# Patient Record
Sex: Female | Born: 1980 | Race: White | Hispanic: No | Marital: Single | Smoking: Former smoker
Health system: Southern US, Community
[De-identification: ages and names within clinical notes are randomized; demographics above are authoritative.]

## PROBLEM LIST (undated history)

## (undated) ENCOUNTER — Inpatient Hospital Stay (HOSPITAL_COMMUNITY): Payer: Self-pay

## (undated) DIAGNOSIS — R569 Unspecified convulsions: Secondary | ICD-10-CM

## (undated) DIAGNOSIS — Q282 Arteriovenous malformation of cerebral vessels: Secondary | ICD-10-CM

## (undated) HISTORY — DX: Arteriovenous malformation of cerebral vessels: Q28.2

---

## 2001-02-01 HISTORY — PX: BRAIN SURGERY: SHX531

## 2001-11-01 DIAGNOSIS — Q282 Arteriovenous malformation of cerebral vessels: Secondary | ICD-10-CM

## 2001-11-01 HISTORY — DX: Arteriovenous malformation of cerebral vessels: Q28.2

## 2002-03-02 ENCOUNTER — Ambulatory Visit (HOSPITAL_COMMUNITY): Admission: RE | Admit: 2002-03-02 | Discharge: 2002-03-02 | Payer: Self-pay | Admitting: Neurosurgery

## 2002-03-02 ENCOUNTER — Encounter: Payer: Self-pay | Admitting: Neurosurgery

## 2002-09-17 ENCOUNTER — Other Ambulatory Visit: Admission: RE | Admit: 2002-09-17 | Discharge: 2002-09-17 | Payer: Self-pay | Admitting: Obstetrics and Gynecology

## 2003-02-13 ENCOUNTER — Inpatient Hospital Stay (HOSPITAL_COMMUNITY): Admission: AD | Admit: 2003-02-13 | Discharge: 2003-02-13 | Payer: Self-pay | Admitting: Obstetrics and Gynecology

## 2003-05-04 ENCOUNTER — Inpatient Hospital Stay (HOSPITAL_COMMUNITY): Admission: AD | Admit: 2003-05-04 | Discharge: 2003-05-07 | Payer: Self-pay | Admitting: Obstetrics and Gynecology

## 2003-05-08 ENCOUNTER — Ambulatory Visit: Admission: RE | Admit: 2003-05-08 | Discharge: 2003-05-08 | Payer: Self-pay | Admitting: Obstetrics and Gynecology

## 2003-05-27 ENCOUNTER — Emergency Department (HOSPITAL_COMMUNITY): Admission: EM | Admit: 2003-05-27 | Discharge: 2003-05-27 | Payer: Self-pay | Admitting: Emergency Medicine

## 2003-09-12 ENCOUNTER — Other Ambulatory Visit: Admission: RE | Admit: 2003-09-12 | Discharge: 2003-09-12 | Payer: Self-pay | Admitting: Obstetrics and Gynecology

## 2004-10-01 ENCOUNTER — Emergency Department (HOSPITAL_COMMUNITY): Admission: EM | Admit: 2004-10-01 | Discharge: 2004-10-01 | Payer: Self-pay | Admitting: Emergency Medicine

## 2012-07-12 ENCOUNTER — Ambulatory Visit (INDEPENDENT_AMBULATORY_CARE_PROVIDER_SITE_OTHER): Payer: BC Managed Care – PPO | Admitting: Family Medicine

## 2012-07-12 VITALS — BP 124/78 | HR 89 | Temp 98.0°F | Resp 16 | Ht 68.5 in | Wt 153.0 lb

## 2012-07-12 DIAGNOSIS — H60392 Other infective otitis externa, left ear: Secondary | ICD-10-CM

## 2012-07-12 DIAGNOSIS — J069 Acute upper respiratory infection, unspecified: Secondary | ICD-10-CM

## 2012-07-12 DIAGNOSIS — H6980 Other specified disorders of Eustachian tube, unspecified ear: Secondary | ICD-10-CM

## 2012-07-12 DIAGNOSIS — H60399 Other infective otitis externa, unspecified ear: Secondary | ICD-10-CM

## 2012-07-12 MED ORDER — AMOXICILLIN-POT CLAVULANATE 875-125 MG PO TABS: 1.0000 | ORAL_TABLET | Freq: Two times a day (BID) | ORAL | Status: DC

## 2012-07-12 NOTE — Progress Notes (Signed)
[address]   [place], [place]  [address]   [phone]  Subjective:    Patient ID: Melanie Vazquez, female    DOB: 1980/07/28, 32 y.o.   MRN: [id number]  HPI This 32 y.o. female presents for evaluation of ear pain, sore throat, congestion.   Onset four days ago.  Went to [facility] yesterday; diagnosed with Otitis Externa; ears so swollen, ear drops are running out; [facility].  Throat culture sent out at [facility].  +fever Tmax 101.  +ST; +ear pain; no pain with laying on ears.  Decreased hearing.  Scant drainage from ears; not sure if due to left over drops.  No rhinorrhea; +nasal congestion mild; green mucous. +dry cough; no sputum; no SOB.  No v/d.  No excessive swimming.  Taking Claritin D and Flonase for past 24 hours.    PCP:  Bubba Hales.   Review of Systems  Constitutional: Positive for fever. Negative for chills, diaphoresis and fatigue.  HENT: Positive for hearing loss, ear pain, congestion, sore throat and ear discharge. Negative for rhinorrhea, sneezing, drooling, mouth sores, trouble swallowing, neck pain, neck stiffness, voice change, postnasal drip and sinus pressure.   Respiratory: Positive for cough. Negative for shortness of breath, wheezing and stridor.   Gastrointestinal: Negative for nausea, vomiting, abdominal pain and diarrhea.  Skin: Negative for rash.  Neurological: Negative for headaches.    Past Medical History  Diagnosis Date  . 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    Past Surgical History  Procedure Laterality Date  . Brain surgery    . Cesarean section      Prior to Admission medications   Medication Sig Start Date End Date Taking? Authorizing Provider  loratadine (CLARITIN) 10 MG tablet Take 10 mg by mouth daily.   Yes Historical Provider, MD  ofloxacin (FLOXIN) 0.3 % otic solution Place 5 drops into both ears 2 (two) times daily.   Yes Historical Provider, MD  amoxicillin-clavulanate  (AUGMENTIN) 875-125 MG per tablet Take 1 tablet by mouth 2 (two) times daily. 07/12/12   Ethelda Chick, MD    Not on File  History   Social History  . Marital Status: Married    Spouse Name: N/A    Number of Children: N/A  . Years of Education: N/A   Occupational History  . Not on file.   Social History Main Topics  . Smoking status: Former Games developer  . Smokeless tobacco: Not on file  . Alcohol Use: Yes  . Drug Use: No  . Sexually Active: Yes    Birth Control/ Protection: IUD   Other Topics Concern  . Not on file   Social History Narrative  . No narrative on file    History reviewed. No pertinent family history.     Objective:   Physical Exam  Nursing note and vitals reviewed. Constitutional: She is oriented to person, place, and time. She appears well-developed and well-nourished. No distress.  HENT:  Head: Normocephalic and atraumatic.  Right Ear: External ear normal.  Left Ear: External ear normal.  Nose: Nose normal.  Mouth/Throat: Oropharynx is clear and moist. No oropharyngeal exudate.  Mild TTP with manipulation of external ear L.  Eyes: Conjunctivae and EOM are normal. Pupils are equal, round, and reactive to light.  Neck: Normal range of motion. Neck supple.  Cardiovascular: Normal rate and regular rhythm.   Pulmonary/Chest: Effort normal and breath sounds normal.  Lymphadenopathy:  She has cervical adenopathy.  Neurological: She is alert and oriented to person, place, and time.  Skin: Skin is warm and dry. No rash noted. She is not diaphoretic.  Psychiatric: She has a normal mood and affect. Her behavior is normal. Judgment and thought content normal.        Assessment & Plan:  Acute upper respiratory infections of unspecified site  Eustachian tube dysfunction, unspecified laterality - Plan: amoxicillin-clavulanate (AUGMENTIN) 875-125 MG per tablet  Otitis, externa, infective, left   1.  Acute URI:  New.  S/p evaluation at [facility];  rapid strep negative and throat culture sent off.  Continue with Flonase and Claritin D.  Continue with Floxin Otic drops.   2.  Eustachian Tube Dysfunction B: New.  Continue with Flonase and Claritin D.  Recommend Ibuprofen tid. 3.  Otitis Externa L: New. Mild.  Continue Floxin Otic drops as prescribed; if no improvement in 72 hours, to fill Augmentin.    Meds ordered this encounter  Medications  . ofloxacin (FLOXIN) 0.3 % otic solution    Sig: Place 5 drops into both ears 2 (two) times daily.  Marland Kitchen loratadine (CLARITIN) 10 MG tablet    Sig: Take 10 mg by mouth daily.  Marland Kitchen amoxicillin-clavulanate (AUGMENTIN) 875-125 MG per tablet    Sig: Take 1 tablet by mouth 2 (two) times daily.    Dispense:  20 tablet    Refill:  0

## 2012-07-12 NOTE — Patient Instructions (Addendum)
1.  RECOMMEND CONTINUING CLARITIN D AND FLONASE FOR THE NEXT THREE DAYS. 2.  TAKE IBUPROFEN 3 TIMES DAILY FOR PAIN, INFLAMMATION. 3.  START AUGMENTIN IN 3-4 DAYS IF EARS ARE STILL PAINFUL.

## 2013-07-20 ENCOUNTER — Other Ambulatory Visit (HOSPITAL_COMMUNITY): Payer: Self-pay | Admitting: Family Medicine

## 2013-07-20 ENCOUNTER — Ambulatory Visit (HOSPITAL_COMMUNITY)
Admission: RE | Admit: 2013-07-20 | Discharge: 2013-07-20 | Disposition: A | Discharge status: Home / Self Care | Payer: BC Managed Care – PPO | Source: Ambulatory Visit | Attending: Vascular Surgery | Admitting: Vascular Surgery

## 2013-07-20 DIAGNOSIS — M7989 Other specified soft tissue disorders: Secondary | ICD-10-CM | POA: Insufficient documentation

## 2013-07-20 DIAGNOSIS — M79609 Pain in unspecified limb: Secondary | ICD-10-CM

## 2013-07-20 DIAGNOSIS — M79605 Pain in left leg: Secondary | ICD-10-CM

## 2013-08-29 ENCOUNTER — Ambulatory Visit (INDEPENDENT_AMBULATORY_CARE_PROVIDER_SITE_OTHER): Payer: BC Managed Care – PPO | Admitting: Emergency Medicine

## 2013-08-29 VITALS — BP 124/88 | HR 73 | Temp 98.1°F | Resp 16 | Ht 70.0 in | Wt 151.0 lb

## 2013-08-29 DIAGNOSIS — A088 Other specified intestinal infections: Secondary | ICD-10-CM

## 2013-08-29 LAB — POCT CBC
GRANULOCYTE PERCENT: 3.5 % — AB (ref 37–80)
HCT, POC: 36.1 % — AB (ref 37.7–47.9)
HEMOGLOBIN: 12.1 g/dL — AB (ref 12.2–16.2)
Lymph, poc: 2.3 (ref 0.6–3.4)
MCH: 31.2 pg (ref 27–31.2)
MCHC: 33.6 g/dL (ref 31.8–35.4)
MCV: 92.8 fL (ref 80–97)
MID (cbc): 0.3 (ref 0–0.9)
MPV: 7.8 fL (ref 0–99.8)
POC Granulocyte: 5.8 (ref 2–6.9)
POC LYMPH PERCENT: 27.6 %L (ref 10–50)
POC MID %: 3.5 %M (ref 0–12)
Platelet Count, POC: 197 10*3/uL (ref 142–424)
RBC: 3.89 M/uL — AB (ref 4.04–5.48)
RDW, POC: 13.4 %
WBC: 8.4 10*3/uL (ref 4.6–10.2)

## 2013-08-29 MED ORDER — LOPERAMIDE HCL 2 MG PO TABS: ORAL_TABLET | ORAL | Status: DC

## 2013-08-29 NOTE — Patient Instructions (Signed)
Viral Gastroenteritis Viral gastroenteritis is also known as stomach flu. This condition affects the stomach and intestinal tract. It can cause sudden diarrhea and vomiting. The illness typically lasts 3 to 8 days. Most people develop an immune response that eventually gets rid of the virus. While this natural response develops, the virus can make you quite ill. CAUSES  Many different viruses can cause gastroenteritis, such as rotavirus or noroviruses. You can catch one of these viruses by consuming contaminated food or water. You may also catch a virus by sharing utensils or other personal items with an infected person or by touching a contaminated surface. SYMPTOMS  The most common symptoms are diarrhea and vomiting. These problems can cause a severe loss of body fluids (dehydration) and a body salt (electrolyte) imbalance. Other symptoms may include:  Fever.  Headache.  Fatigue.  Abdominal pain. DIAGNOSIS  Your caregiver can usually diagnose viral gastroenteritis based on your symptoms and a physical exam. A stool sample may also be taken to test for the presence of viruses or other infections. TREATMENT  This illness typically goes away on its own. Treatments are aimed at rehydration. The most serious cases of viral gastroenteritis involve vomiting so severely that you are not able to keep fluids down. In these cases, fluids must be given through an intravenous line (IV). HOME CARE INSTRUCTIONS   Drink enough fluids to keep your urine clear or pale yellow. Drink small amounts of fluids frequently and increase the amounts as tolerated.  Ask your caregiver for specific rehydration instructions.  Avoid:  Foods high in sugar.  Alcohol.  Carbonated drinks.  Tobacco.  Juice.  Caffeine drinks.  Extremely hot or cold fluids.  Fatty, greasy foods.  Too much intake of anything at one time.  Dairy products until 24 to 48 hours after diarrhea stops.  You may consume probiotics.  Probiotics are active cultures of beneficial bacteria. They may lessen the amount and number of diarrheal stools in adults. Probiotics can be found in yogurt with active cultures and in supplements.  Wash your hands well to avoid spreading the virus.  Only take over-the-counter or prescription medicines for pain, discomfort, or fever as directed by your caregiver. Do not give aspirin to children. Antidiarrheal medicines are not recommended.  Ask your caregiver if you should continue to take your regular prescribed and over-the-counter medicines.  Keep all follow-up appointments as directed by your caregiver. SEEK IMMEDIATE MEDICAL CARE IF:   You are unable to keep fluids down.  You do not urinate at least once every 6 to 8 hours.  You develop shortness of breath.  You notice blood in your stool or vomit. This may look like coffee grounds.  You have abdominal pain that increases or is concentrated in one small area (localized).  You have persistent vomiting or diarrhea.  You have a fever.  The patient is a child younger than 3 months, and he or she has a fever.  The patient is a child older than 3 months, and he or she has a fever and persistent symptoms.  The patient is a child older than 3 months, and he or she has a fever and symptoms suddenly get worse.  The patient is a baby, and he or she has no tears when crying. MAKE SURE YOU:   Understand these instructions.  Will watch your condition.  Will get help right away if you are not doing well or get worse. Document Released: 01/18/2005 Document Revised: 04/12/2011 Document Reviewed: 11/04/2010   ExitCare Patient Information 2015 [employer]. This information is not intended to replace advice given to you by your health care provider. Make sure you discuss any questions you have with your health care provider. Clear Liquid Diet A clear liquid diet is a short-term diet that is prescribed to provide the necessary fluid and  basic energy you need when you can have nothing else. The clear liquid diet consists of liquids or solids that will become liquid at room temperature. You should be able to see through the liquid. There are many reasons that you may be restricted to clear liquids, such as:  When you have a sudden-onset (acute) condition that occurs before or after surgery.  To help your body slowly get adjusted to food again after a long period when you were unable to have food.  Replacement of fluids when you have a diarrheal disease.  When you are going to have certain exams, such as a colonoscopy, in which instruments are inserted inside your body to look at parts of your digestive system. WHAT CAN I HAVE? A clear liquid diet does not provide all the nutrients you need. It is important to choose a variety of the following items to get as many nutrients as possible:  Vegetable juices that do not have pulp.  Fruit juices and fruit drinks that do not have pulp.  Coffee (regular or decaffeinated), tea, or soda at the discretion of your health care provider.  Clear bouillon, broth, or strained broth-based soups.  High-protein and flavored gelatins.  Sugar or honey.  Ices or frozen ice pops that do not contain milk. If you are not sure whether you can have certain items, you should ask your health care provider. You may also ask your health care provider if there are any other clear liquid options. Document Released: 01/18/2005 Document Revised: 01/23/2013 Document Reviewed: 12/15/2012 ExitCare Patient Information 2015 [employer]. This information is not intended to replace advice given to you by your health care provider. Make sure you discuss any questions you have with your health care provider.  

## 2013-08-29 NOTE — Progress Notes (Signed)
[facility] [address], [place] [id number] [phone]  Date:  08/29/2013   Name:  Kinnie FeilBrandi S Barros   DOB:  04/28/1980   MRN:  [id number]  PCP:  Gaspar GarbeISOVEC,RICHARD W, MD    Chief Complaint: Abdominal Pain   History of Present Illness:  Kinnie FeilBrandi S Arnaud is a 33 y.o. very pleasant female patient who presents with the following:  Ill for 2 weeks with frequent watery stools.  The patient has no complaint of blood, mucous, or pus in her stools.  No nausea or vomiting.  No fever or chills.  Some upper abdominal pain that is colicky.  No sketchy water or travel.  No ill contacts.  No family history of IBS.  The patient has no complaint of blood, mucous, or pus in her stools. No improvement with over the counter medications or other home remedies. Denies other complaint or health concern today.    There are no active problems to display for this patient.   Past Medical History  Diagnosis Date  . 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    Past Surgical History  Procedure Laterality Date  . Brain surgery    . Cesarean section      History  Substance Use Topics  . Smoking status: Former Games developermoker  . Smokeless tobacco: Not on file  . Alcohol Use: Yes    No family history on file.  Not on File  Medication list has been reviewed and updated.  No current outpatient prescriptions on file prior to visit.   No current facility-administered medications on file prior to visit.    Review of Systems:  As per HPI, otherwise negative.    Physical Examination: Filed Vitals:   08/29/13 2001  BP: 124/88  Pulse: 73  Temp: 98.1 F (36.7 C)  Resp: 16   Filed Vitals:   08/29/13 2001  Height: 5\' 10"  (1.778 m)  Weight: 151 lb (68.493 kg)   Body mass index is 21.67 kg/(m^2). Ideal Body Weight: Weight in (lb) to have BMI = 25: 173.9  GEN: WDWN, NAD, Non-toxic, A & O x 3 HEENT: Atraumatic, Normocephalic. Neck supple. No masses, No  LAD. Ears and Nose: No external deformity. CV: RRR, No M/G/R. No JVD. No thrill. No extra heart sounds. PULM: CTA B, no wheezes, crackles, rhonchi. No retractions. No resp. distress. No accessory muscle use. ABD: S, NT, ND, +BS. No rebound. No HSM. EXTR: No c/c/e NEURO Normal gait.  PSYCH: Normally interactive. Conversant. Not depressed or anxious appearing.  Calm demeanor.    Assessment and Plan: Gastroenteritis Imodium Labs pending   Signed,  Phillips OdorJeffery Glendia Olshefski, MD

## 2013-08-30 LAB — COMPREHENSIVE METABOLIC PANEL
ALT: 14 U/L (ref 0–35)
AST: 15 U/L (ref 0–37)
Albumin: 4.2 g/dL (ref 3.5–5.2)
Alkaline Phosphatase: 32 U/L — ABNORMAL LOW (ref 39–117)
BUN: 13 mg/dL (ref 6–23)
CALCIUM: 9.2 mg/dL (ref 8.4–10.5)
CHLORIDE: 101 meq/L (ref 96–112)
CO2: 24 meq/L (ref 19–32)
CREATININE: 0.68 mg/dL (ref 0.50–1.10)
Glucose, Bld: 98 mg/dL (ref 70–99)
POTASSIUM: 4.2 meq/L (ref 3.5–5.3)
SODIUM: 136 meq/L (ref 135–145)
TOTAL PROTEIN: 6.9 g/dL (ref 6.0–8.3)
Total Bilirubin: 0.4 mg/dL (ref 0.2–1.2)

## 2013-08-31 LAB — OVA AND PARASITE EXAMINATION: OP: NONE SEEN

## 2013-09-03 LAB — STOOL CULTURE

## 2014-05-08 ENCOUNTER — Ambulatory Visit (INDEPENDENT_AMBULATORY_CARE_PROVIDER_SITE_OTHER): Payer: BLUE CROSS/BLUE SHIELD | Admitting: Family Medicine

## 2014-05-08 ENCOUNTER — Ambulatory Visit (INDEPENDENT_AMBULATORY_CARE_PROVIDER_SITE_OTHER): Payer: BLUE CROSS/BLUE SHIELD

## 2014-05-08 VITALS — BP 128/82 | HR 98 | Temp 98.7°F | Resp 16 | Ht 69.0 in | Wt 164.0 lb

## 2014-05-08 DIAGNOSIS — M79601 Pain in right arm: Secondary | ICD-10-CM | POA: Diagnosis not present

## 2014-05-08 DIAGNOSIS — S40021A Contusion of right upper arm, initial encounter: Secondary | ICD-10-CM

## 2014-05-08 DIAGNOSIS — M791 Myalgia, unspecified site: Secondary | ICD-10-CM

## 2014-05-08 MED ORDER — CYCLOBENZAPRINE HCL 10 MG PO TABS: 10.0000 mg | ORAL_TABLET | Freq: Two times a day (BID) | ORAL | Status: DC | PRN
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
## 2014-05-08 NOTE — Patient Instructions (Signed)
Use ice and the wrist splint as needed for pain in your hand and wrist You will probably be more sore tomorrow. Use ibuprofen/ tylenol as needed and the muscle relaxer.  rememeber do not drive while taking the muscle relaxer.  Let me know if your wrist does not feel ok again in about one week

## 2014-05-16 ENCOUNTER — Telehealth: Payer: Self-pay

## 2014-05-16 DIAGNOSIS — S60211A Contusion of right wrist, initial encounter: Secondary | ICD-10-CM

## 2014-05-16 NOTE — Telephone Encounter (Signed)
Called her back and spoke with her- her wrist seems to be getting worse. She is having a hard time using her wrist at work.   We attempted to schedule an ortho appt for her tomorrow but most local offices do not accept her insurance and would need cash up front.  Callie discussed with her- she plans to try the walk- in ortho clinic, but if not successful she will come back and see us for a recheck

## 2014-05-16 NOTE — Telephone Encounter (Signed)
Can we refer to Ortho? I didn't see in the notes if she should RTC if not better. Please advise.
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
## 2014-05-20 ENCOUNTER — Telehealth: Payer: Self-pay

## 2014-05-20 DIAGNOSIS — M25539 Pain in unspecified wrist: Secondary | ICD-10-CM
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
## 2014-05-21 MED ORDER — TRAMADOL HCL 50 MG PO TABS: 50.0000 mg | ORAL_TABLET | Freq: Three times a day (TID) | ORAL | Status: DC | PRN

## 2014-05-21 NOTE — Telephone Encounter (Signed)
Ultram written for pain. Can pick up rx from front desk.

## 2014-05-21 NOTE — Telephone Encounter (Signed)
Called in rx and pt informed.

## 2014-05-21 NOTE — Telephone Encounter (Signed)
Wrist is still pretty swollen and painful. It wakes her up at night throbbing. She wants a pain med that she can take during the day so she can work. Please advise.

## 2014-05-27 ENCOUNTER — Other Ambulatory Visit: Payer: Self-pay | Admitting: Family Medicine

## 2014-05-27 DIAGNOSIS — S63509D Unspecified sprain of unspecified wrist, subsequent encounter: Secondary | ICD-10-CM

## 2014-05-28 NOTE — Telephone Encounter (Signed)
Called her- I had given her flexeril at her first visit but this made her too sleepy to take during the day.  We had called her in tramadol on 4/19- she is taking this during the day. She was seen at ortho walk-in and was told that her wrist was just strained.  Advised her that I will rx another #20 tramadol but as this is a narcotic this will need to be all. She understands and had not realized that tramadol is a narcotic

## 2014-05-28 NOTE — Telephone Encounter (Signed)
Dr Patsy Lageropland, pt was Rxd tramadol in your absence on 4/19, but is asking for RF. Since you saw pt I will send to you to review.

## 2014-06-13 ENCOUNTER — Telehealth: Payer: Self-pay

## 2014-06-13 NOTE — Telephone Encounter (Signed)
Patient left voicemail at 2:06pm requesting records for hand [facility] be [facility]ed to [facility] at [phone]. It looks like she has a referral but I'm not sure it was specifically for [facility]. Cb# is [phone] (work #)

## 2014-06-17 ENCOUNTER — Other Ambulatory Visit: Payer: Self-pay | Admitting: Orthopedic Surgery

## 2014-06-17 DIAGNOSIS — T1490XA Injury, unspecified, initial encounter: Secondary | ICD-10-CM

## 2014-06-20 ENCOUNTER — Ambulatory Visit
Admission: RE | Admit: 2014-06-20 | Discharge: 2014-06-20 | Disposition: A | Discharge status: Home / Self Care | Payer: BLUE CROSS/BLUE SHIELD | Source: Ambulatory Visit | Attending: Orthopedic Surgery | Admitting: Orthopedic Surgery

## 2014-06-20 DIAGNOSIS — T1490XA Injury, unspecified, initial encounter: Secondary | ICD-10-CM

## 2014-06-20 MED ORDER — IOHEXOL 180 MG/ML  SOLN
3.0000 mL | Freq: Once | INTRAMUSCULAR | Status: AC | PRN
  Administered 2014-06-20: 3 mL via INTRA_ARTICULAR

## 2014-10-03 ENCOUNTER — Other Ambulatory Visit: Payer: Self-pay | Admitting: Orthopedic Surgery

## 2014-10-09 ENCOUNTER — Encounter (HOSPITAL_BASED_OUTPATIENT_CLINIC_OR_DEPARTMENT_OTHER): Payer: Self-pay | Admitting: *Deleted

## 2014-10-17 ENCOUNTER — Encounter (HOSPITAL_BASED_OUTPATIENT_CLINIC_OR_DEPARTMENT_OTHER)
Admission: RE | Disposition: A | Discharge status: Home / Self Care | Payer: Self-pay | Source: Ambulatory Visit | Attending: Orthopedic Surgery

## 2014-10-17 ENCOUNTER — Ambulatory Visit (HOSPITAL_BASED_OUTPATIENT_CLINIC_OR_DEPARTMENT_OTHER): Payer: BLUE CROSS/BLUE SHIELD | Admitting: Anesthesiology

## 2014-10-17 ENCOUNTER — Ambulatory Visit (HOSPITAL_BASED_OUTPATIENT_CLINIC_OR_DEPARTMENT_OTHER)
Admission: RE | Admit: 2014-10-17 | Discharge: 2014-10-17 | Disposition: A | Discharge status: Home / Self Care | Payer: BLUE CROSS/BLUE SHIELD | Source: Ambulatory Visit | Attending: Orthopedic Surgery | Admitting: Orthopedic Surgery

## 2014-10-17 ENCOUNTER — Encounter (HOSPITAL_BASED_OUTPATIENT_CLINIC_OR_DEPARTMENT_OTHER): Payer: Self-pay | Admitting: Anesthesiology

## 2014-10-17 DIAGNOSIS — S62151A Displaced fracture of hook process of hamate [unciform] bone, right wrist, initial encounter for closed fracture: Secondary | ICD-10-CM | POA: Insufficient documentation

## 2014-10-17 DIAGNOSIS — Z87891 Personal history of nicotine dependence: Secondary | ICD-10-CM | POA: Diagnosis not present

## 2014-10-17 HISTORY — DX: Unspecified convulsions: R56.9

## 2014-10-17 HISTORY — PX: CARPECTOMY: SHX5004

## 2014-10-17 SURGERY — CARPECTOMY
Anesthesia: General | Site: Hand | Laterality: Right

## 2014-10-17 MED ORDER — PROPOFOL 10 MG/ML IV BOLUS
INTRAVENOUS | Status: AC
  Filled 2014-10-17: qty 20

## 2014-10-17 MED ORDER — FENTANYL CITRATE (PF) 100 MCG/2ML IJ SOLN
INTRAMUSCULAR | Status: AC
  Filled 2014-10-17: qty 4

## 2014-10-17 MED ORDER — DEXAMETHASONE SODIUM PHOSPHATE 10 MG/ML IJ SOLN
INTRAMUSCULAR | Status: DC | PRN
  Administered 2014-10-17: 10 mg via INTRAVENOUS

## 2014-10-17 MED ORDER — PROPOFOL 10 MG/ML IV BOLUS
INTRAVENOUS | Status: DC | PRN
  Administered 2014-10-17: 150 mg via INTRAVENOUS

## 2014-10-17 MED ORDER — ONDANSETRON HCL 4 MG/2ML IJ SOLN
INTRAMUSCULAR | Status: DC | PRN
  Administered 2014-10-17: 4 mg via INTRAVENOUS

## 2014-10-17 MED ORDER — OXYCODONE HCL 5 MG PO TABS
ORAL_TABLET | ORAL | Status: AC
  Filled 2014-10-17: qty 1

## 2014-10-17 MED ORDER — GLYCOPYRROLATE 0.2 MG/ML IJ SOLN: 0.2000 mg | Freq: Once | INTRAMUSCULAR | Status: DC | PRN

## 2014-10-17 MED ORDER — MIDAZOLAM HCL 2 MG/2ML IJ SOLN
INTRAMUSCULAR | Status: AC
  Filled 2014-10-17: qty 4

## 2014-10-17 MED ORDER — HYDROMORPHONE HCL 1 MG/ML IJ SOLN
0.2500 mg | INTRAMUSCULAR | Status: DC | PRN
  Administered 2014-10-17 (×3): 0.5 mg via INTRAVENOUS

## 2014-10-17 MED ORDER — HYDROCODONE-ACETAMINOPHEN 5-325 MG PO TABS: ORAL_TABLET | ORAL | Status: AC

## 2014-10-17 MED ORDER — HYDROMORPHONE HCL 1 MG/ML IJ SOLN
INTRAMUSCULAR | Status: AC
  Filled 2014-10-17: qty 1

## 2014-10-17 MED ORDER — SCOPOLAMINE 1 MG/3DAYS TD PT72: 1.0000 | MEDICATED_PATCH | Freq: Once | TRANSDERMAL | Status: DC | PRN

## 2014-10-17 MED ORDER — CEFAZOLIN SODIUM-DEXTROSE 2-3 GM-% IV SOLR
2.0000 g | INTRAVENOUS | Status: AC
  Administered 2014-10-17: 2 g via INTRAVENOUS

## 2014-10-17 MED ORDER — LIDOCAINE HCL (CARDIAC) 20 MG/ML IV SOLN
INTRAVENOUS | Status: AC
  Filled 2014-10-17: qty 5

## 2014-10-17 MED ORDER — MIDAZOLAM HCL 5 MG/5ML IJ SOLN
INTRAMUSCULAR | Status: DC | PRN
  Administered 2014-10-17: 2 mg via INTRAVENOUS

## 2014-10-17 MED ORDER — OXYCODONE HCL 5 MG/5ML PO SOLN
5.0000 mg | Freq: Once | ORAL | Status: AC | PRN
Start: 2014-10-17 — End: 2014-10-17

## 2014-10-17 MED ORDER — CEFAZOLIN SODIUM-DEXTROSE 2-3 GM-% IV SOLR
INTRAVENOUS | Status: AC
  Filled 2014-10-17: qty 50

## 2014-10-17 MED ORDER — LACTATED RINGERS IV SOLN
INTRAVENOUS | Status: DC
  Administered 2014-10-17 (×2): via INTRAVENOUS

## 2014-10-17 MED ORDER — CHLORHEXIDINE GLUCONATE 4 % EX LIQD: 60.0000 mL | Freq: Once | CUTANEOUS | Status: DC

## 2014-10-17 MED ORDER — BUPIVACAINE HCL (PF) 0.25 % IJ SOLN
INTRAMUSCULAR | Status: DC | PRN
  Administered 2014-10-17: 7 mL

## 2014-10-17 MED ORDER — MIDAZOLAM HCL 2 MG/2ML IJ SOLN: 1.0000 mg | INTRAMUSCULAR | Status: DC | PRN

## 2014-10-17 MED ORDER — ONDANSETRON HCL 4 MG/2ML IJ SOLN
INTRAMUSCULAR | Status: AC
  Filled 2014-10-17: qty 2

## 2014-10-17 MED ORDER — FENTANYL CITRATE (PF) 100 MCG/2ML IJ SOLN
INTRAMUSCULAR | Status: DC | PRN
  Administered 2014-10-17 (×2): 25 ug via INTRAVENOUS
  Administered 2014-10-17 (×2): 50 ug via INTRAVENOUS

## 2014-10-17 MED ORDER — SODIUM CHLORIDE 0.9 % IR SOLN
Status: DC | PRN
  Administered 2014-10-17: 100 mL

## 2014-10-17 MED ORDER — OXYCODONE HCL 5 MG PO TABS
5.0000 mg | ORAL_TABLET | Freq: Once | ORAL | Status: AC | PRN
  Administered 2014-10-17: 5 mg via ORAL

## 2014-10-17 MED ORDER — FENTANYL CITRATE (PF) 100 MCG/2ML IJ SOLN
50.0000 ug | INTRAMUSCULAR | Status: DC | PRN
Start: 2014-10-17 — End: 2014-10-17

## 2014-10-17 MED ORDER — PROMETHAZINE HCL 25 MG/ML IJ SOLN
6.2500 mg | INTRAMUSCULAR | Status: DC | PRN
Start: 2014-10-17 — End: 2014-10-17

## 2014-10-17 MED ORDER — KETOROLAC TROMETHAMINE 30 MG/ML IJ SOLN: 30.0000 mg | Freq: Once | INTRAMUSCULAR | Status: DC

## 2014-10-17 MED ORDER — LIDOCAINE HCL (CARDIAC) 20 MG/ML IV SOLN
INTRAVENOUS | Status: DC | PRN
Start: 2014-10-17 — End: 2014-10-17
  Administered 2014-10-17: 50 mg via INTRAVENOUS

## 2014-10-17 SURGICAL SUPPLY — 55 items
BANDAGE ELASTIC 3 VELCRO ST LF (GAUZE/BANDAGES/DRESSINGS) ×1 IMPLANT
BANDAGE ELASTIC 4 VELCRO ST LF (GAUZE/BANDAGES/DRESSINGS) IMPLANT
BLADE ARTHRO LOK 4 BEAVER (BLADE) ×1 IMPLANT
BLADE EAR TYMPAN 2.5 60D BEAV (BLADE) ×1 IMPLANT
BLADE MINI RND TIP GREEN BEAV (BLADE) ×2 IMPLANT
BLADE SURG 15 STRL LF DISP TIS (BLADE) ×2 IMPLANT
BLADE SURG 15 STRL SS (BLADE) ×4
BNDG CMPR 9X4 STRL LF SNTH (GAUZE/BANDAGES/DRESSINGS) ×1
BNDG ESMARK 4X9 LF (GAUZE/BANDAGES/DRESSINGS) ×2 IMPLANT
BNDG GAUZE ELAST 4 BULKY (GAUZE/BANDAGES/DRESSINGS) ×2 IMPLANT
CHLORAPREP W/TINT 26ML (MISCELLANEOUS) ×2 IMPLANT
CORDS BIPOLAR (ELECTRODE) ×2 IMPLANT
COVER BACK TABLE 60X90IN (DRAPES) ×2 IMPLANT
COVER MAYO STAND STRL (DRAPES) ×2 IMPLANT
CUFF TOURNIQUET SINGLE 18IN (TOURNIQUET CUFF) ×1 IMPLANT
DECANTER SPIKE VIAL GLASS SM (MISCELLANEOUS) IMPLANT
DRAPE EXTREMITY T 121X128X90 (DRAPE) ×2 IMPLANT
DRAPE OEC MINIVIEW 54X84 (DRAPES) ×2 IMPLANT
DRAPE SURG 17X23 STRL (DRAPES) ×2 IMPLANT
DRSG PAD ABDOMINAL 8X10 ST (GAUZE/BANDAGES/DRESSINGS) ×1 IMPLANT
GAUZE SPONGE 4X4 12PLY STRL (GAUZE/BANDAGES/DRESSINGS) ×2 IMPLANT
GAUZE XEROFORM 1X8 LF (GAUZE/BANDAGES/DRESSINGS) ×2 IMPLANT
GLOVE BIO SURGEON STRL SZ7.5 (GLOVE) ×2 IMPLANT
GLOVE BIOGEL PI IND STRL 7.0 (GLOVE) IMPLANT
GLOVE BIOGEL PI IND STRL 8 (GLOVE) ×1 IMPLANT
GLOVE BIOGEL PI IND STRL 8.5 (GLOVE) ×1 IMPLANT
GLOVE BIOGEL PI INDICATOR 7.0 (GLOVE) ×2
GLOVE BIOGEL PI INDICATOR 8 (GLOVE) ×1
GLOVE BIOGEL PI INDICATOR 8.5 (GLOVE) ×1
GLOVE ECLIPSE 6.5 STRL STRAW (GLOVE) ×1 IMPLANT
GLOVE SURG ORTHO 8.0 STRL STRW (GLOVE) ×2 IMPLANT
GOWN STRL REUS W/ TWL LRG LVL3 (GOWN DISPOSABLE) ×1 IMPLANT
GOWN STRL REUS W/TWL LRG LVL3 (GOWN DISPOSABLE) ×2
GOWN STRL REUS W/TWL XL LVL3 (GOWN DISPOSABLE) ×2 IMPLANT
NS IRRIG 1000ML POUR BTL (IV SOLUTION) ×2 IMPLANT
PACK BASIN DAY SURGERY FS (CUSTOM PROCEDURE TRAY) ×2 IMPLANT
PAD CAST 4YDX4 CTTN HI CHSV (CAST SUPPLIES) ×1 IMPLANT
PADDING CAST ABS 3INX4YD NS (CAST SUPPLIES)
PADDING CAST ABS 4INX4YD NS (CAST SUPPLIES)
PADDING CAST ABS COTTON 3X4 (CAST SUPPLIES) IMPLANT
PADDING CAST ABS COTTON 4X4 ST (CAST SUPPLIES) ×1 IMPLANT
PADDING CAST COTTON 4X4 STRL (CAST SUPPLIES)
SLEEVE SCD COMPRESS KNEE MED (MISCELLANEOUS) ×1 IMPLANT
SPLINT PLASTER CAST XFAST 3X15 (CAST SUPPLIES) IMPLANT
SPLINT PLASTER XTRA FASTSET 3X (CAST SUPPLIES) ×1
STOCKINETTE 4X48 STRL (DRAPES) ×2 IMPLANT
SUT ETHILON 4 0 PS 2 18 (SUTURE) ×2 IMPLANT
SUT MERSILENE 3 0 FS 1 (SUTURE) ×1 IMPLANT
SUT VIC AB 2-0 SH 27 (SUTURE)
SUT VIC AB 2-0 SH 27XBRD (SUTURE) IMPLANT
SUT VICRYL 4-0 PS2 18IN ABS (SUTURE) ×1 IMPLANT
SYR BULB 3OZ (MISCELLANEOUS) ×2 IMPLANT
SYR CONTROL 10ML LL (SYRINGE) ×1 IMPLANT
TOWEL OR 17X24 6PK STRL BLUE (TOWEL DISPOSABLE) ×2 IMPLANT
UNDERPAD 30X30 (UNDERPADS AND DIAPERS) ×2 IMPLANT

## 2014-10-17 NOTE — Anesthesia Procedure Notes (Signed)
Procedure Name: LMA Insertion Date/Time: 10/17/2014 1:23 PM Performed by: Genevieve Norlander L Pre-anesthesia Checklist: Patient identified, Emergency Drugs available, Suction available, Patient being monitored and Timeout performed Patient Re-evaluated:Patient Re-evaluated prior to inductionOxygen Delivery Method: Circle System Utilized Preoxygenation: Pre-oxygenation with 100% oxygen Intubation Type: IV induction Ventilation: Mask ventilation without difficulty LMA: LMA inserted LMA Size: 4.0 Number of attempts: 1 Airway Equipment and Method: Bite block Placement Confirmation: positive ETCO2 Tube secured with: Tape Dental Injury: Teeth and Oropharynx as per pre-operative assessment
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
## 2014-10-17 NOTE — Brief Op Note (Signed)
10/17/2014  2:09 PM  PATIENT:  Kinnie Feil  34 y.o. female  PRE-OPERATIVE DIAGNOSIS:  RIGHT HOOK OF HAMATE FRACTURE   POST-OPERATIVE DIAGNOSIS:  RIGHT HOOK OF HAMATE FRACTURE   PROCEDURE:  Procedure(s): RIGHT HOOK OF HAMATE EXCISION  (Right), decompression Guyon's canal  SURGEON:  Surgeon(s) and Role:    * Betha Loa, MD - Primary    * Cindee Salt, MD - Assisting  PHYSICIAN ASSISTANT:   ASSISTANTS: Cindee Salt, MD   ANESTHESIA:   general  EBL:  Total I/O In: 1500 [I.V.:1500] Out: -   BLOOD ADMINISTERED:none  DRAINS: none   LOCAL MEDICATIONS USED:  MARCAINE     SPECIMEN:  No Specimen  DISPOSITION OF SPECIMEN:  N/A  COUNTS:  YES  TOURNIQUET:   Total Tourniquet Time Documented: Upper Arm (Right) - 28 minutes Total: Upper Arm (Right) - 28 minutes   DICTATION: .Other Dictation: Dictation Number [id number]  PLAN OF CARE: Discharge to home after PACU  PATIENT DISPOSITION:  PACU - hemodynamically stable.

## 2014-10-17 NOTE — Anesthesia Preprocedure Evaluation (Addendum)
Anesthesia Evaluation  Patient identified by MRN, date of birth, ID band Patient awake    Reviewed: Allergy & Precautions, NPO status , Patient's Chart, lab work & pertinent test results  Airway Mallampati: II  TM Distance: >3 FB Neck ROM: Full    Dental   Pulmonary former smoker,    breath sounds clear to auscultation       Cardiovascular negative cardio ROS   Rhythm:Regular Rate:Normal     Neuro/Psych Seizures -,  S/p AVM resection    GI/Hepatic negative GI ROS, Neg liver ROS,   Endo/Other  negative endocrine ROS  Renal/GU negative Renal ROS     Musculoskeletal   Abdominal   Peds  Hematology negative hematology ROS (+)   Anesthesia Other Findings   Reproductive/Obstetrics                            Anesthesia Physical Anesthesia Plan  ASA: II  Anesthesia Plan: General   Post-op Pain Management:    Induction: Intravenous  Airway Management Planned: LMA  Additional Equipment:   Intra-op Plan:   Post-operative Plan:   Informed Consent: I have reviewed the patients History and Physical, chart, labs and discussed the procedure including the risks, benefits and alternatives for the proposed anesthesia with the patient or authorized representative who has indicated his/her understanding and acceptance.     Plan Discussed with: CRNA  Anesthesia Plan Comments:        Anesthesia Quick Evaluation

## 2014-10-17 NOTE — Transfer of Care (Signed)
Immediate Anesthesia Transfer of Care Note  Patient: Melanie Vazquez  Procedure(s) Performed: Procedure(s): RIGHT HOOK OF HAMATE EXCISION  (Right)  Patient Location: PACU  Anesthesia Type:General  Level of Consciousness: sedated and patient cooperative  Airway & Oxygen Therapy: Patient Spontanous Breathing and Patient connected to face mask oxygen  Post-op Assessment: Report given to RN and Post -op Vital signs reviewed and stable  Post vital signs: Reviewed and stable  Last Vitals:  Filed Vitals:   10/17/14 1143  BP: 114/73  Pulse: 84  Temp: 36.9 C  Resp: 20    Complications: No apparent anesthesia complications

## 2014-10-17 NOTE — Anesthesia Postprocedure Evaluation (Signed)
  Anesthesia Post-op Note  Patient: Melanie Vazquez  Procedure(s) Performed: Procedure(s): RIGHT HOOK OF HAMATE EXCISION  (Right)  Patient Location: PACU  Anesthesia Type:General  Level of Consciousness: awake and alert   Airway and Oxygen Therapy: Patient Spontanous Breathing  Post-op Pain: mild  Post-op Assessment: Post-op Vital signs reviewed              Post-op Vital Signs: Reviewed  Last Vitals:  Filed Vitals:   10/17/14 1445  BP: 115/71  Pulse: 77  Temp:   Resp: 20    Complications: No apparent anesthesia complications

## 2014-10-17 NOTE — Op Note (Signed)
[id number] 

## 2014-10-17 NOTE — Discharge Instructions (Addendum)

## 2014-10-18 ENCOUNTER — Encounter (HOSPITAL_BASED_OUTPATIENT_CLINIC_OR_DEPARTMENT_OTHER): Payer: Self-pay | Admitting: Orthopedic Surgery
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
## 2015-01-09 ENCOUNTER — Telehealth: Payer: Self-pay

## 2015-01-09 NOTE — Telephone Encounter (Signed)
Waiting on payment of $11.25 for 15 pages from [employer].

## 2015-01-21 NOTE — Telephone Encounter (Signed)
Called [employer] they stated they mailed a check on 01/17/15, will wait for payment before sending records.

## 2015-01-30 DIAGNOSIS — Z0271 Encounter for disability determination: Secondary | ICD-10-CM

## 2015-01-30 NOTE — Telephone Encounter (Signed)
Payment received and records faxed on 01/30/15.

## 2020-02-14 ENCOUNTER — Other Ambulatory Visit: Payer: Self-pay

## 2020-02-14 DIAGNOSIS — Z20822 Contact with and (suspected) exposure to covid-19: Secondary | ICD-10-CM

## 2020-02-16 LAB — NOVEL CORONAVIRUS, NAA: SARS-CoV-2, NAA: NOT DETECTED

## 2020-02-16 LAB — SARS-COV-2, NAA 2 DAY TAT

## 2021-02-19 ENCOUNTER — Other Ambulatory Visit: Payer: Self-pay | Admitting: Obstetrics and Gynecology

## 2021-02-19 DIAGNOSIS — R928 Other abnormal and inconclusive findings on diagnostic imaging of breast: Secondary | ICD-10-CM

## 2021-03-10 ENCOUNTER — Ambulatory Visit
Admission: RE | Admit: 2021-03-10 | Discharge: 2021-03-10 | Disposition: A | Discharge status: Home / Self Care | Payer: BC Managed Care – PPO | Source: Ambulatory Visit | Attending: Obstetrics and Gynecology | Admitting: Obstetrics and Gynecology

## 2021-03-10 ENCOUNTER — Other Ambulatory Visit: Payer: Self-pay | Admitting: Obstetrics and Gynecology

## 2021-03-10 DIAGNOSIS — R928 Other abnormal and inconclusive findings on diagnostic imaging of breast: Secondary | ICD-10-CM

## 2021-03-10 DIAGNOSIS — N6320 Unspecified lump in the left breast, unspecified quadrant: Secondary | ICD-10-CM

## 2021-03-20 ENCOUNTER — Other Ambulatory Visit: Payer: BC Managed Care – PPO

## 2021-09-08 ENCOUNTER — Ambulatory Visit
Admission: RE | Admit: 2021-09-08 | Discharge: 2021-09-08 | Disposition: A | Discharge status: Home / Self Care | Payer: BC Managed Care – PPO | Source: Ambulatory Visit | Attending: Obstetrics and Gynecology | Admitting: Obstetrics and Gynecology

## 2021-09-08 ENCOUNTER — Other Ambulatory Visit: Payer: Self-pay | Admitting: Obstetrics and Gynecology

## 2021-09-08 DIAGNOSIS — N6320 Unspecified lump in the left breast, unspecified quadrant: Secondary | ICD-10-CM

## 2022-03-12 ENCOUNTER — Ambulatory Visit
Admission: RE | Admit: 2022-03-12 | Discharge: 2022-03-12 | Disposition: A | Discharge status: Home / Self Care | Payer: BC Managed Care – PPO | Source: Ambulatory Visit | Attending: Obstetrics and Gynecology | Admitting: Obstetrics and Gynecology

## 2022-03-12 ENCOUNTER — Other Ambulatory Visit: Payer: Self-pay | Admitting: Obstetrics and Gynecology

## 2022-03-12 DIAGNOSIS — N6320 Unspecified lump in the left breast, unspecified quadrant: Secondary | ICD-10-CM

## 2022-08-27 IMAGING — MG MM DIGITAL DIAGNOSTIC UNILAT*L* W/ TOMO W/ CAD
6 series · 6 of 18 positions shown · non-contrast
Comparison: Previous exam(s).

CLINICAL DATA: Screening recall from baseline for possible left
breast asymmetries.

EXAM:
DIGITAL DIAGNOSTIC UNILATERAL LEFT MAMMOGRAM WITH TOMOSYNTHESIS AND
CAD; ULTRASOUND LEFT BREAST LIMITED
TECHNIQUE: Left digital diagnostic mammography and breast tomosynthesis was
performed. The images were evaluated with computer-aided detection.;
Targeted ultrasound examination of the left breast was performed.

[L ML synth-2D]
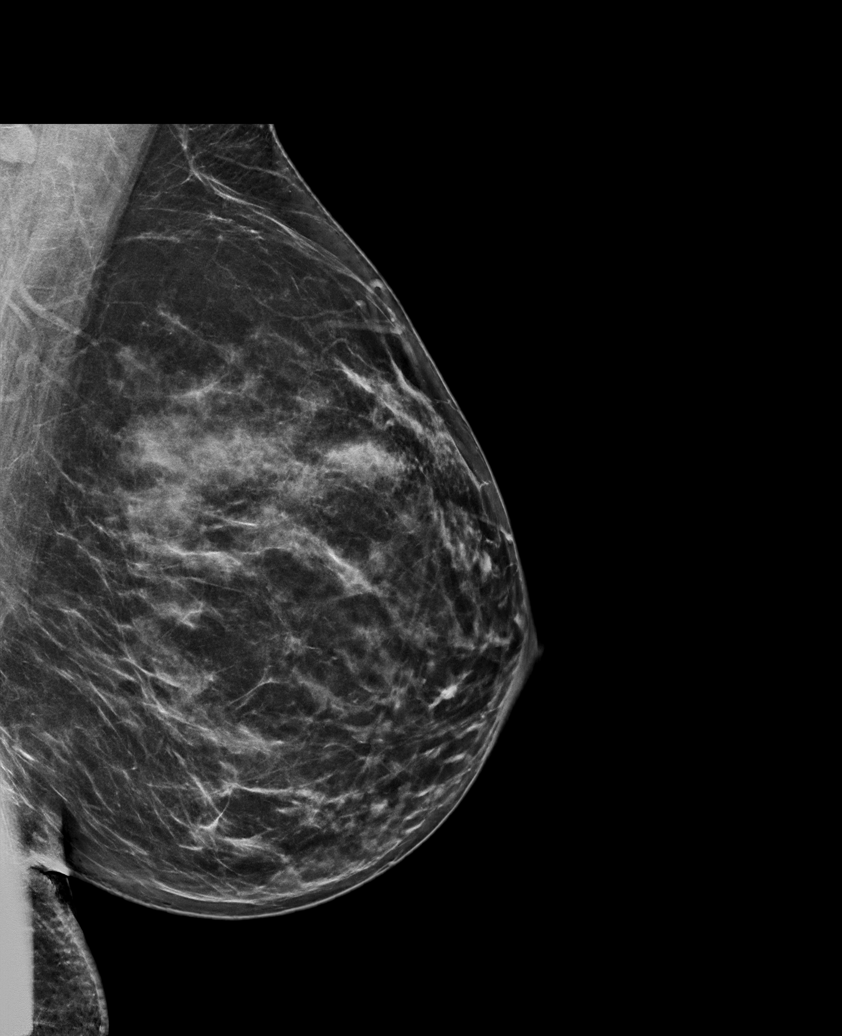

[L CC synth-2D (1 of 2)]
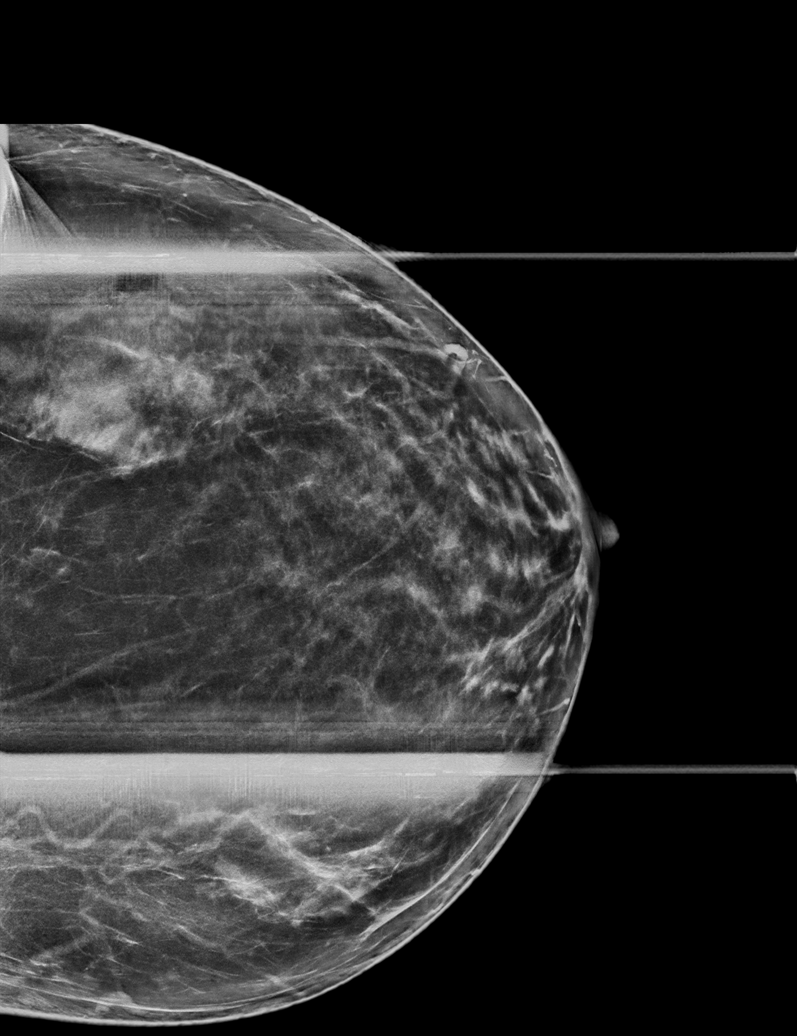

[L CC synth-2D (2 of 2)]
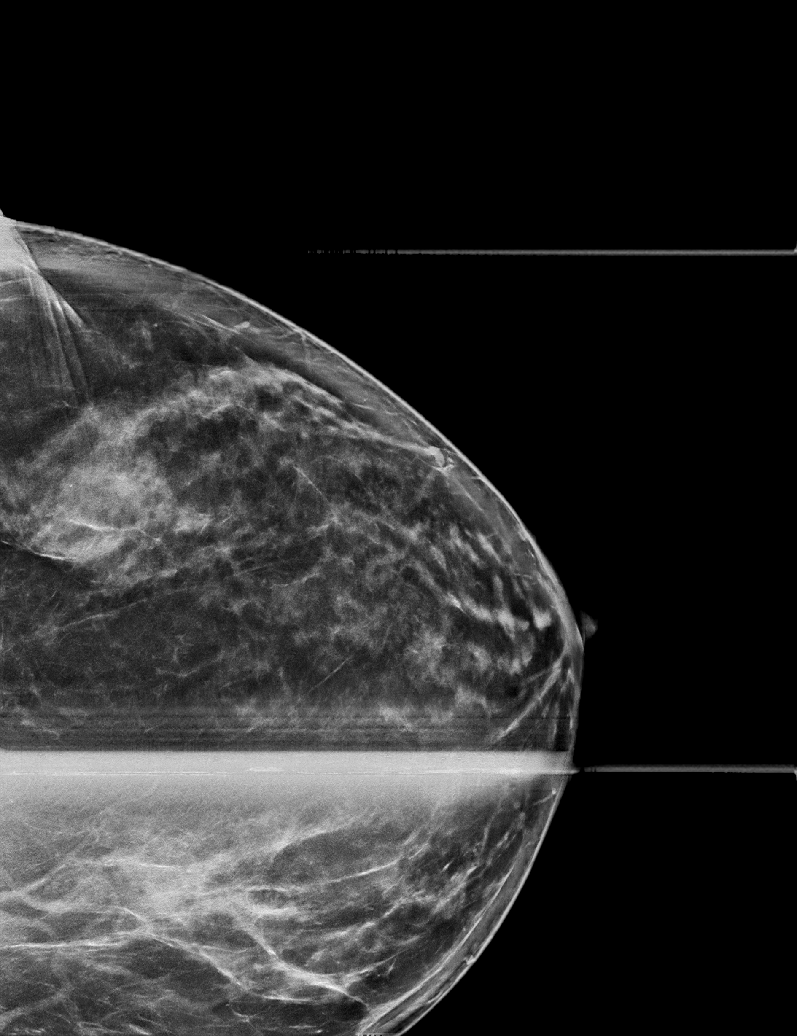

[L ML tomo · tomo slice 37/74.0]
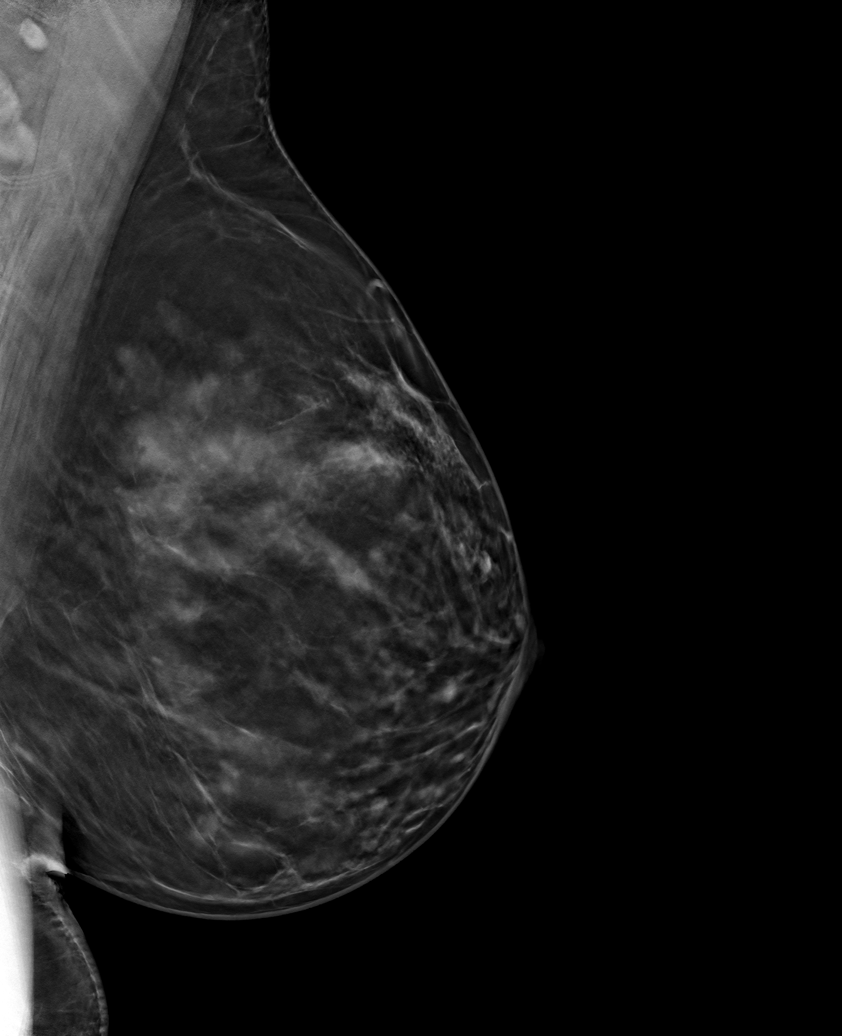

[L CC tomo (1 of 2) · tomo slice 41/80.0]
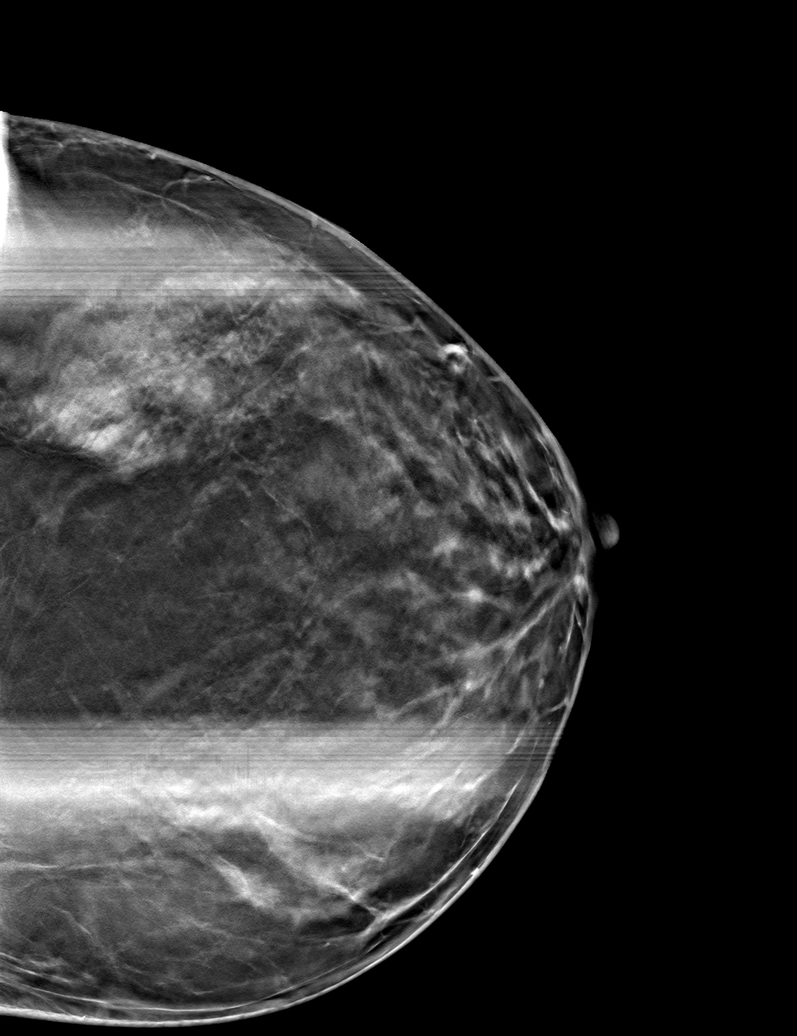

[L CC tomo (2 of 2) · tomo slice 42/83.0]
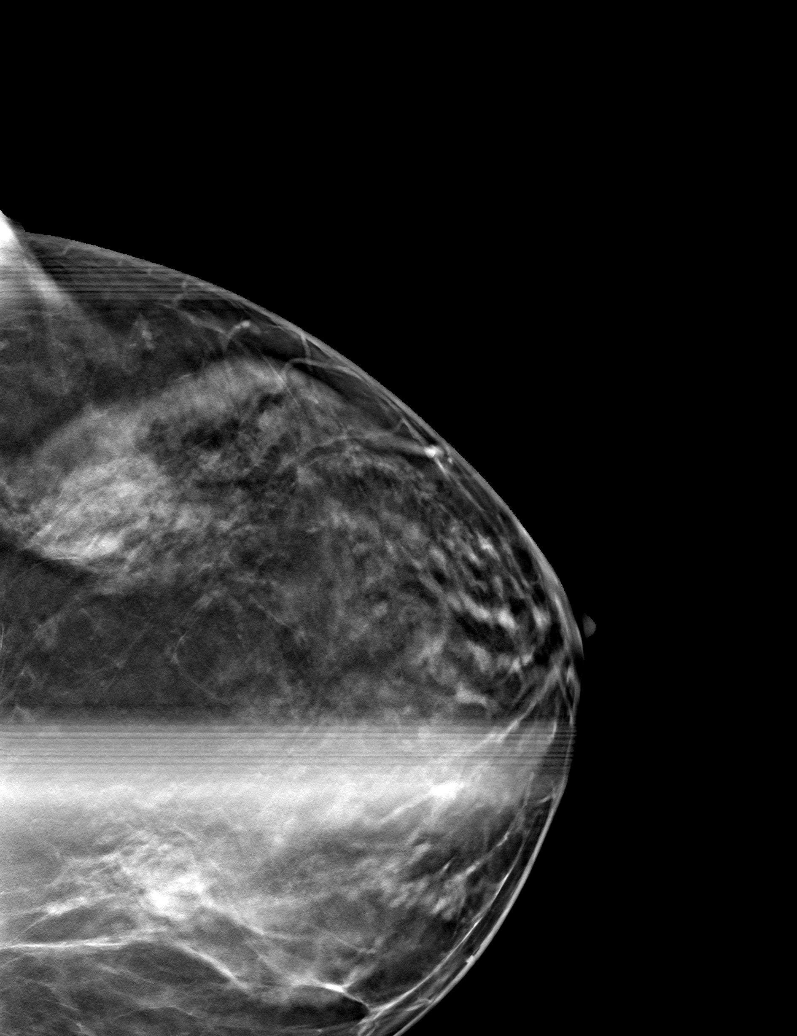

[6 of 18 positions shown; findings below may reference images not displayed]

ACR Breast Density Category c: The breast tissue is heterogeneously
dense, which may obscure small masses.
FINDINGS: Spot compression tomosynthesis images through the lateral aspect of
the left breast demonstrates a persistent dense asymmetry in the
lateral far posterior breast. The asymmetry which was slightly
medial and anterior to the larger persistent asymmetry has resolved.

Ultrasound targeted to the left breast at 3 o'clock, 7 cm from the
nipple demonstrates a bilobed oval hypoechoic mass measuring 9 x 5 x
8 mm.
IMPRESSION: There is a 9 mm mass in the left breast at 3 o'clock, which may
represent a complicated cyst or a benign fibroadenoma.

RECOMMENDATION:
Six-month follow-up diagnostic left breast mammogram and ultrasound.

I have discussed the findings and recommendations with the patient.
If applicable, a reminder letter will be sent to the patient
regarding the next appointment.

BI-RADS CATEGORY  3: Probably benign.
